# Patient Record
Sex: Female | Born: 1995 | Race: White | Hispanic: No | Smoking: Never smoker
Health system: Southern US, Community
[De-identification: ages and names within clinical notes are randomized; demographics above are authoritative.]

## PROBLEM LIST (undated history)

## (undated) DIAGNOSIS — L940 Localized scleroderma [morphea]: Secondary | ICD-10-CM

## (undated) DIAGNOSIS — K743 Primary biliary cirrhosis: Secondary | ICD-10-CM

---

## 2019-05-26 ENCOUNTER — Encounter: Payer: Self-pay | Admitting: Intensive Care

## 2019-05-26 ENCOUNTER — Emergency Department
Admission: EM | Admit: 2019-05-26 | Discharge: 2019-05-26 | Disposition: A | Discharge status: Home / Self Care | Payer: BC Managed Care – PPO | Attending: Student in an Organized Health Care Education/Training Program | Admitting: Student in an Organized Health Care Education/Training Program

## 2019-05-26 ENCOUNTER — Emergency Department: Payer: BC Managed Care – PPO

## 2019-05-26 ENCOUNTER — Other Ambulatory Visit: Payer: Self-pay

## 2019-05-26 DIAGNOSIS — M5412 Radiculopathy, cervical region: Secondary | ICD-10-CM

## 2019-05-26 DIAGNOSIS — Y93I9 Activity, other involving external motion: Secondary | ICD-10-CM | POA: Insufficient documentation

## 2019-05-26 DIAGNOSIS — S199XXA Unspecified injury of neck, initial encounter: Secondary | ICD-10-CM | POA: Diagnosis present

## 2019-05-26 DIAGNOSIS — M545 Low back pain, unspecified: Secondary | ICD-10-CM

## 2019-05-26 DIAGNOSIS — Y999 Unspecified external cause status: Secondary | ICD-10-CM | POA: Insufficient documentation

## 2019-05-26 DIAGNOSIS — M79605 Pain in left leg: Secondary | ICD-10-CM

## 2019-05-26 DIAGNOSIS — Y92410 Unspecified street and highway as the place of occurrence of the external cause: Secondary | ICD-10-CM | POA: Insufficient documentation

## 2019-05-26 DIAGNOSIS — S161XXA Strain of muscle, fascia and tendon at neck level, initial encounter: Secondary | ICD-10-CM | POA: Diagnosis not present

## 2019-05-26 HISTORY — DX: Localized scleroderma (morphea): L94.0

## 2019-05-26 HISTORY — DX: Primary biliary cirrhosis: K74.3

## 2019-05-26 MED ORDER — CYCLOBENZAPRINE HCL 10 MG PO TABS
5.0000 mg | ORAL_TABLET | Freq: Once | ORAL | Status: AC
  Administered 2019-05-26: 5 mg via ORAL
  Filled 2019-05-26: qty 1

## 2019-05-26 MED ORDER — IBUPROFEN 600 MG PO TABS: 600.0000 mg | ORAL_TABLET | Freq: Four times a day (QID) | ORAL | 0 refills | Status: AC | PRN

## 2019-05-26 MED ORDER — CYCLOBENZAPRINE HCL 5 MG PO TABS: 5.0000 mg | ORAL_TABLET | Freq: Three times a day (TID) | ORAL | 0 refills | Status: AC | PRN

## 2019-05-26 MED ORDER — IBUPROFEN 600 MG PO TABS
600.0000 mg | ORAL_TABLET | Freq: Once | ORAL | Status: AC
  Administered 2019-05-26: 600 mg via ORAL
  Filled 2019-05-26: qty 1
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
